# Patient Record
Sex: Female | Born: 1995
Health system: Southern US, Community
[De-identification: ages and names within clinical notes are randomized; demographics above are authoritative.]

## PROBLEM LIST (undated history)

## (undated) DIAGNOSIS — N1370 Vesicoureteral-reflux, unspecified: Secondary | ICD-10-CM

## (undated) HISTORY — DX: Vesicoureteral-reflux, unspecified: N13.70

---
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
## 2017-01-08 DIAGNOSIS — Z23 Encounter for immunization: Secondary | ICD-10-CM | POA: Diagnosis not present

## 2017-01-19 DIAGNOSIS — F321 Major depressive disorder, single episode, moderate: Secondary | ICD-10-CM | POA: Diagnosis not present

## 2017-01-26 DIAGNOSIS — F321 Major depressive disorder, single episode, moderate: Secondary | ICD-10-CM | POA: Diagnosis not present

## 2017-02-05 DIAGNOSIS — J3501 Chronic tonsillitis: Secondary | ICD-10-CM | POA: Diagnosis not present

## 2017-02-05 DIAGNOSIS — J353 Hypertrophy of tonsils with hypertrophy of adenoids: Secondary | ICD-10-CM | POA: Diagnosis not present

## 2017-02-05 DIAGNOSIS — J343 Hypertrophy of nasal turbinates: Secondary | ICD-10-CM | POA: Diagnosis not present

## 2017-02-05 DIAGNOSIS — J3503 Chronic tonsillitis and adenoiditis: Secondary | ICD-10-CM | POA: Diagnosis not present

## 2017-03-09 DIAGNOSIS — F321 Major depressive disorder, single episode, moderate: Secondary | ICD-10-CM | POA: Diagnosis not present

## 2017-03-16 DIAGNOSIS — F321 Major depressive disorder, single episode, moderate: Secondary | ICD-10-CM | POA: Diagnosis not present

## 2017-03-30 DIAGNOSIS — F321 Major depressive disorder, single episode, moderate: Secondary | ICD-10-CM | POA: Diagnosis not present

## 2017-04-13 DIAGNOSIS — F321 Major depressive disorder, single episode, moderate: Secondary | ICD-10-CM | POA: Diagnosis not present

## 2017-04-24 DIAGNOSIS — Z113 Encounter for screening for infections with a predominantly sexual mode of transmission: Secondary | ICD-10-CM | POA: Diagnosis not present

## 2017-05-11 DIAGNOSIS — F321 Major depressive disorder, single episode, moderate: Secondary | ICD-10-CM | POA: Diagnosis not present

## 2017-06-01 DIAGNOSIS — F321 Major depressive disorder, single episode, moderate: Secondary | ICD-10-CM | POA: Diagnosis not present

## 2017-08-31 DIAGNOSIS — F321 Major depressive disorder, single episode, moderate: Secondary | ICD-10-CM | POA: Diagnosis not present

## 2017-09-09 DIAGNOSIS — F321 Major depressive disorder, single episode, moderate: Secondary | ICD-10-CM | POA: Diagnosis not present

## 2017-11-16 DIAGNOSIS — F321 Major depressive disorder, single episode, moderate: Secondary | ICD-10-CM | POA: Diagnosis not present

## 2017-12-07 DIAGNOSIS — F321 Major depressive disorder, single episode, moderate: Secondary | ICD-10-CM | POA: Diagnosis not present

## 2017-12-24 DIAGNOSIS — F321 Major depressive disorder, single episode, moderate: Secondary | ICD-10-CM | POA: Diagnosis not present

## 2018-01-19 DIAGNOSIS — F321 Major depressive disorder, single episode, moderate: Secondary | ICD-10-CM | POA: Diagnosis not present

## 2018-01-20 DIAGNOSIS — L02426 Furuncle of left lower limb: Secondary | ICD-10-CM | POA: Diagnosis not present

## 2018-01-20 DIAGNOSIS — B9689 Other specified bacterial agents as the cause of diseases classified elsewhere: Secondary | ICD-10-CM | POA: Diagnosis not present

## 2018-01-20 DIAGNOSIS — L02425 Furuncle of right lower limb: Secondary | ICD-10-CM | POA: Diagnosis not present

## 2018-01-20 DIAGNOSIS — L0232 Furuncle of buttock: Secondary | ICD-10-CM | POA: Diagnosis not present

## 2018-02-02 DIAGNOSIS — F321 Major depressive disorder, single episode, moderate: Secondary | ICD-10-CM | POA: Diagnosis not present

## 2018-03-09 DIAGNOSIS — Z23 Encounter for immunization: Secondary | ICD-10-CM | POA: Diagnosis not present

## 2018-03-11 DIAGNOSIS — F321 Major depressive disorder, single episode, moderate: Secondary | ICD-10-CM | POA: Diagnosis not present

## 2018-03-29 DIAGNOSIS — F321 Major depressive disorder, single episode, moderate: Secondary | ICD-10-CM | POA: Diagnosis not present

## 2018-04-19 DIAGNOSIS — F321 Major depressive disorder, single episode, moderate: Secondary | ICD-10-CM | POA: Diagnosis not present

## 2018-05-03 DIAGNOSIS — F321 Major depressive disorder, single episode, moderate: Secondary | ICD-10-CM | POA: Diagnosis not present

## 2018-06-26 DIAGNOSIS — S29012A Strain of muscle and tendon of back wall of thorax, initial encounter: Secondary | ICD-10-CM | POA: Diagnosis not present

## 2018-06-26 DIAGNOSIS — S39012A Strain of muscle, fascia and tendon of lower back, initial encounter: Secondary | ICD-10-CM | POA: Diagnosis not present

## 2018-07-02 DIAGNOSIS — F321 Major depressive disorder, single episode, moderate: Secondary | ICD-10-CM | POA: Diagnosis not present

## 2018-07-30 ENCOUNTER — Telehealth: Payer: Self-pay

## 2018-07-30 NOTE — Telephone Encounter (Signed)
Copied from CRM [id number]. Topic: Appointment Scheduling - Scheduling Inquiry for Clinic >> Jul 29, 2018  4:21 PM Gwenlyn Fudge A wrote: Reason for CRM: Pt called requesting to schedule a physical, routine blood work, std testing. Pt is also requesting a referral for OBGYN. Please advise.

## 2018-07-30 NOTE — Telephone Encounter (Signed)
Patient scheduled appointment on 08/12/18.

## 2018-08-12 ENCOUNTER — Ambulatory Visit (INDEPENDENT_AMBULATORY_CARE_PROVIDER_SITE_OTHER): Payer: BC Managed Care – PPO | Admitting: Family Medicine

## 2018-08-12 ENCOUNTER — Other Ambulatory Visit: Payer: Self-pay

## 2018-08-12 ENCOUNTER — Encounter: Payer: Self-pay | Admitting: Family Medicine

## 2018-08-12 ENCOUNTER — Other Ambulatory Visit: Payer: Self-pay | Admitting: Internal Medicine

## 2018-08-12 VITALS — BP 128/83 | HR 88 | Temp 97.6°F | Ht 69.5 in | Wt 251.5 lb

## 2018-08-12 DIAGNOSIS — Z113 Encounter for screening for infections with a predominantly sexual mode of transmission: Secondary | ICD-10-CM

## 2018-08-12 DIAGNOSIS — Z30018 Encounter for initial prescription of other contraceptives: Secondary | ICD-10-CM | POA: Diagnosis not present

## 2018-08-12 DIAGNOSIS — Z309 Encounter for contraceptive management, unspecified: Secondary | ICD-10-CM | POA: Insufficient documentation

## 2018-08-12 DIAGNOSIS — F411 Generalized anxiety disorder: Secondary | ICD-10-CM

## 2018-08-12 DIAGNOSIS — E669 Obesity, unspecified: Secondary | ICD-10-CM | POA: Diagnosis not present

## 2018-08-12 DIAGNOSIS — Z0000 Encounter for general adult medical examination without abnormal findings: Secondary | ICD-10-CM | POA: Diagnosis not present

## 2018-08-12 LAB — CBC WITH DIFFERENTIAL/PLATELET
Basophils Absolute: 0 10*3/uL (ref 0.0–0.1)
Basophils Relative: 0.3 % (ref 0.0–3.0)
Eosinophils Absolute: 0.1 10*3/uL (ref 0.0–0.7)
Eosinophils Relative: 1.3 % (ref 0.0–5.0)
HCT: 39.1 % (ref 36.0–46.0)
Hemoglobin: 12.3 g/dL (ref 12.0–15.0)
Lymphocytes Relative: 25.4 % (ref 12.0–46.0)
Lymphs Abs: 1.4 10*3/uL (ref 0.7–4.0)
MCHC: 31.6 g/dL (ref 30.0–36.0)
MCV: 77.9 fl — ABNORMAL LOW (ref 78.0–100.0)
Monocytes Absolute: 0.5 10*3/uL (ref 0.1–1.0)
Monocytes Relative: 9.7 % (ref 3.0–12.0)
Neutro Abs: 3.4 10*3/uL (ref 1.4–7.7)
Neutrophils Relative %: 63.3 % (ref 43.0–77.0)
Platelets: 245 10*3/uL (ref 150.0–400.0)
RBC: 5.02 Mil/uL (ref 3.87–5.11)
RDW: 16.4 % — ABNORMAL HIGH (ref 11.5–14.6)
WBC: 5.4 10*3/uL (ref 4.5–10.5)

## 2018-08-12 LAB — COMPREHENSIVE METABOLIC PANEL
ALT: 12 U/L (ref 0–35)
AST: 12 U/L (ref 0–37)
Albumin: 4.3 g/dL (ref 3.5–5.2)
Alkaline Phosphatase: 59 U/L (ref 39–117)
BUN: 10 mg/dL (ref 6–23)
CO2: 23 mEq/L (ref 19–32)
Calcium: 9.3 mg/dL (ref 8.4–10.5)
Chloride: 107 mEq/L (ref 96–112)
Creatinine, Ser: 0.63 mg/dL (ref 0.40–1.20)
GFR: 119.55 mL/min (ref >60.00)
Glucose, Bld: 89 mg/dL (ref 70–99)
Potassium: 4 mEq/L (ref 3.5–5.1)
Sodium: 138 mEq/L (ref 135–145)
Total Bilirubin: 0.4 mg/dL (ref 0.2–1.2)
Total Protein: 7.1 g/dL (ref 6.0–8.3)

## 2018-08-12 LAB — LIPID PANEL
Cholesterol: 185 mg/dL (ref 0–200)
HDL: 43.8 mg/dL (ref >39.00)
LDL Cholesterol: 126 mg/dL — ABNORMAL HIGH (ref 0–99)
NonHDL: 141.38
Total CHOL/HDL Ratio: 4
Triglycerides: 75 mg/dL (ref 0.0–149.0)
VLDL: 15 mg/dL (ref 0.0–40.0)

## 2018-08-12 LAB — TSH: TSH: 2.22 u[IU]/mL (ref 0.35–5.50)

## 2018-08-12 NOTE — Patient Instructions (Signed)
Continue counseling Also healthy diet and exercise   Labs today   The office will call about gyn referral
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
## 2018-08-13 LAB — RPR: RPR Ser Ql: NONREACTIVE

## 2018-08-13 LAB — HIV ANTIBODY (ROUTINE TESTING W REFLEX): HIV 1&2 Ab, 4th Generation: NONREACTIVE

## 2018-08-13 LAB — C. TRACHOMATIS/N. GONORRHOEAE RNA
C. trachomatis RNA, TMA: NOT DETECTED
N. gonorrhoeae RNA, TMA: NOT DETECTED

## 2018-08-15 DIAGNOSIS — F411 Generalized anxiety disorder: Secondary | ICD-10-CM | POA: Insufficient documentation

## 2018-08-15 NOTE — Assessment & Plan Note (Signed)
Discussed how this problem influences overall health and the risks it imposes  Reviewed plan for weight loss with lower calorie diet (via better food choices and also portion control or program like weight watchers) and exercise building up to or more than 30 minutes 5 days per week including some aerobic activity    

## 2018-08-15 NOTE — Assessment & Plan Note (Signed)
Pt is interested in IUD  Ref to gyn made to discuss   STD screening today

## 2018-08-15 NOTE — Assessment & Plan Note (Signed)
Reviewed health habits including diet and exercise and skin cancer prevention Reviewed appropriate screening tests for age  Also reviewed health mt list, fam hx and immunization status , as well as social and family history   See HPI Labs ordered for wellness STD screening done Ref to gyn to discuss contraception  Enc good health habits and wt loss

## 2018-08-15 NOTE — Assessment & Plan Note (Signed)
Seeing counselor Has a support animal  Doing much better

## 2018-08-25 DIAGNOSIS — Z6837 Body mass index (BMI) 37.0-37.9, adult: Secondary | ICD-10-CM | POA: Diagnosis not present

## 2018-08-25 DIAGNOSIS — Z01419 Encounter for gynecological examination (general) (routine) without abnormal findings: Secondary | ICD-10-CM | POA: Diagnosis not present

## 2018-08-25 DIAGNOSIS — Z113 Encounter for screening for infections with a predominantly sexual mode of transmission: Secondary | ICD-10-CM | POA: Diagnosis not present

## 2018-09-14 DIAGNOSIS — Z30430 Encounter for insertion of intrauterine contraceptive device: Secondary | ICD-10-CM | POA: Diagnosis not present

## 2018-09-21 DIAGNOSIS — F321 Major depressive disorder, single episode, moderate: Secondary | ICD-10-CM | POA: Diagnosis not present

## 2018-10-25 DIAGNOSIS — Z20828 Contact with and (suspected) exposure to other viral communicable diseases: Secondary | ICD-10-CM | POA: Diagnosis not present

## 2018-11-09 DIAGNOSIS — Z20828 Contact with and (suspected) exposure to other viral communicable diseases: Secondary | ICD-10-CM | POA: Diagnosis not present

## 2018-12-07 DIAGNOSIS — Z20828 Contact with and (suspected) exposure to other viral communicable diseases: Secondary | ICD-10-CM | POA: Diagnosis not present

## 2019-03-16 DIAGNOSIS — Z23 Encounter for immunization: Secondary | ICD-10-CM | POA: Diagnosis not present

## 2019-03-23 DIAGNOSIS — Z23 Encounter for immunization: Secondary | ICD-10-CM | POA: Diagnosis not present

## 2019-04-06 ENCOUNTER — Ambulatory Visit (INDEPENDENT_AMBULATORY_CARE_PROVIDER_SITE_OTHER): Payer: BC Managed Care – PPO | Admitting: Family Medicine

## 2019-04-06 ENCOUNTER — Encounter: Payer: Self-pay | Admitting: Family Medicine

## 2019-04-06 DIAGNOSIS — M549 Dorsalgia, unspecified: Secondary | ICD-10-CM

## 2019-04-06 DIAGNOSIS — F411 Generalized anxiety disorder: Secondary | ICD-10-CM | POA: Diagnosis not present

## 2019-04-06 MED ORDER — TIZANIDINE HCL 4 MG PO TABS: 4.0000 mg | ORAL_TABLET | Freq: Four times a day (QID) | ORAL | 1 refills | Status: DC | PRN
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
## 2019-06-12 DIAGNOSIS — S93401A Sprain of unspecified ligament of right ankle, initial encounter: Secondary | ICD-10-CM | POA: Diagnosis not present

## 2019-08-30 ENCOUNTER — Encounter: Payer: Self-pay | Admitting: Family Medicine

## 2019-08-30 ENCOUNTER — Ambulatory Visit (INDEPENDENT_AMBULATORY_CARE_PROVIDER_SITE_OTHER): Payer: BC Managed Care – PPO | Admitting: Family Medicine

## 2019-08-30 ENCOUNTER — Other Ambulatory Visit: Payer: Self-pay

## 2019-08-30 VITALS — BP 114/60 | HR 77 | Temp 96.7°F | Resp 18 | Ht 69.0 in | Wt 301.0 lb

## 2019-08-30 DIAGNOSIS — E785 Hyperlipidemia, unspecified: Secondary | ICD-10-CM | POA: Diagnosis not present

## 2019-08-30 DIAGNOSIS — Z01419 Encounter for gynecological examination (general) (routine) without abnormal findings: Secondary | ICD-10-CM | POA: Diagnosis not present

## 2019-08-30 DIAGNOSIS — Z113 Encounter for screening for infections with a predominantly sexual mode of transmission: Secondary | ICD-10-CM | POA: Diagnosis not present

## 2019-08-30 DIAGNOSIS — Z6841 Body Mass Index (BMI) 40.0 and over, adult: Secondary | ICD-10-CM | POA: Diagnosis not present

## 2019-08-30 DIAGNOSIS — M549 Dorsalgia, unspecified: Secondary | ICD-10-CM

## 2019-08-30 DIAGNOSIS — N920 Excessive and frequent menstruation with regular cycle: Secondary | ICD-10-CM

## 2019-08-30 DIAGNOSIS — Z0000 Encounter for general adult medical examination without abnormal findings: Secondary | ICD-10-CM

## 2019-08-30 DIAGNOSIS — Z1159 Encounter for screening for other viral diseases: Secondary | ICD-10-CM | POA: Diagnosis not present

## 2019-08-30 LAB — HM PAP SMEAR

## 2019-08-30 NOTE — Assessment & Plan Note (Signed)
Added this to labs Also doing other std screen  No symptoms

## 2019-08-30 NOTE — Assessment & Plan Note (Signed)
Disc imp of safe sexual practices Lab for screen, gc/chlam , HIV , RPR, hep C  Is utd pap from yesterday

## 2019-08-30 NOTE — Assessment & Plan Note (Signed)
Reviewed health habits including diet and exercise and skin cancer prevention Reviewed appropriate screening tests for age  Also reviewed health mt list, fam hx and immunization status , as well as social and family history   Labs ordered for wellness and also for STD screening  Disc strategy for wt loss (needs to work on emotional eating- NOOM program recommended)  [facility] Encouraged flu shot in the fall  Enc more exercise  Rev PHQ questions - stress was high and improved now

## 2019-08-30 NOTE — Patient Instructions (Addendum)
Work on coping mechanisms for stress- outdoor activities and walking dog/exercise  Add more activity in general  Socialize more also - think about doing active things together   Call us with the dates of your covid vaccines so we can put them in your chart   For cholesterol  Avoid red meat/ fried foods/ egg yolks/ fatty breakfast meats/ butter, cheese and high fat dairy/ and shellfish    Look into the NOOM program for healthy weight and to work on emotional eating   Labs today  STD screen today   I will place a PT referral and the office will call you

## 2019-08-30 NOTE — Assessment & Plan Note (Signed)
Disc goals for lipids and reasons to control them Rev last labs with pt Rev low sat fat diet in detail High LDL runs in family  Diet is sub optimal Lab today  Info given re: diet improvement
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
## 2019-08-30 NOTE — Assessment & Plan Note (Signed)
Doing better with progestin IUD
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
## 2019-08-30 NOTE — Assessment & Plan Note (Signed)
Discussed how this problem influences overall health and the risks it imposes  Reviewed plan for weight loss with lower calorie diet (via better food choices and also portion control or program like weight watchers) and exercise building up to or more than 30 minutes 5 days per week including some aerobic activity   Emotional eating is a problem Suggested NOOM program on line  May also benefit from healthy wt and wellness clinic in the future

## 2019-08-31 LAB — CBC WITH DIFFERENTIAL/PLATELET
Basophils Absolute: 0 10*3/uL (ref 0.0–0.1)
Basophils Relative: 0.4 % (ref 0.0–3.0)
Eosinophils Absolute: 0.1 10*3/uL (ref 0.0–0.7)
Eosinophils Relative: 1.2 % (ref 0.0–5.0)
HCT: 38.6 % (ref 36.0–46.0)
Hemoglobin: 13 g/dL (ref 12.0–15.0)
Lymphocytes Relative: 22.7 % (ref 12.0–46.0)
Lymphs Abs: 1.9 10*3/uL (ref 0.7–4.0)
MCHC: 33.6 g/dL (ref 30.0–36.0)
MCV: 84 fl (ref 78.0–100.0)
Monocytes Absolute: 0.6 10*3/uL (ref 0.1–1.0)
Monocytes Relative: 7.3 % (ref 3.0–12.0)
Neutro Abs: 5.6 10*3/uL (ref 1.4–7.7)
Neutrophils Relative %: 68.4 % (ref 43.0–77.0)
Platelets: 222 10*3/uL (ref 150.0–400.0)
RBC: 4.6 Mil/uL (ref 3.87–5.11)
RDW: 13.9 % (ref 11.5–15.5)
WBC: 8.2 10*3/uL (ref 4.0–10.5)

## 2019-08-31 LAB — COMPREHENSIVE METABOLIC PANEL
ALT: 22 U/L (ref 0–35)
AST: 15 U/L (ref 0–37)
Albumin: 4.3 g/dL (ref 3.5–5.2)
Alkaline Phosphatase: 65 U/L (ref 39–117)
BUN: 16 mg/dL (ref 6–23)
CO2: 26 mEq/L (ref 19–32)
Calcium: 9.1 mg/dL (ref 8.4–10.5)
Chloride: 105 mEq/L (ref 96–112)
Creatinine, Ser: 0.61 mg/dL (ref 0.40–1.20)
GFR: 122.85 mL/min (ref >60.00)
Glucose, Bld: 67 mg/dL — ABNORMAL LOW (ref 70–99)
Potassium: 4.1 mEq/L (ref 3.5–5.1)
Sodium: 139 mEq/L (ref 135–145)
Total Bilirubin: 0.3 mg/dL (ref 0.2–1.2)
Total Protein: 7.1 g/dL (ref 6.0–8.3)

## 2019-08-31 LAB — LIPID PANEL
Cholesterol: 223 mg/dL — ABNORMAL HIGH (ref 0–200)
HDL: 38.1 mg/dL — ABNORMAL LOW (ref >39.00)
NonHDL: 184.56
Total CHOL/HDL Ratio: 6
Triglycerides: 297 mg/dL — ABNORMAL HIGH (ref 0.0–149.0)
VLDL: 59.4 mg/dL — ABNORMAL HIGH (ref 0.0–40.0)

## 2019-08-31 LAB — HEPATITIS C ANTIBODY
Hepatitis C Ab: NONREACTIVE
SIGNAL TO CUT-OFF: 0.01 (ref <1.00)

## 2019-08-31 LAB — TSH: TSH: 1.56 u[IU]/mL (ref 0.35–4.50)

## 2019-08-31 LAB — LDL CHOLESTEROL, DIRECT: Direct LDL: 157 mg/dL

## 2019-08-31 LAB — C. TRACHOMATIS/N. GONORRHOEAE RNA
C. trachomatis RNA, TMA: NOT DETECTED
N. gonorrhoeae RNA, TMA: NOT DETECTED

## 2019-08-31 LAB — HIV ANTIBODY (ROUTINE TESTING W REFLEX): HIV 1&2 Ab, 4th Generation: NONREACTIVE

## 2019-08-31 LAB — RPR: RPR Ser Ql: NONREACTIVE

## 2019-09-08 DIAGNOSIS — S39012D Strain of muscle, fascia and tendon of lower back, subsequent encounter: Secondary | ICD-10-CM | POA: Diagnosis not present

## 2019-09-08 DIAGNOSIS — M545 Low back pain: Secondary | ICD-10-CM | POA: Diagnosis not present

## 2019-09-12 DIAGNOSIS — S39012D Strain of muscle, fascia and tendon of lower back, subsequent encounter: Secondary | ICD-10-CM | POA: Diagnosis not present

## 2019-09-12 DIAGNOSIS — M545 Low back pain: Secondary | ICD-10-CM | POA: Diagnosis not present

## 2019-09-16 DIAGNOSIS — M545 Low back pain: Secondary | ICD-10-CM | POA: Diagnosis not present

## 2019-09-16 DIAGNOSIS — S39012D Strain of muscle, fascia and tendon of lower back, subsequent encounter: Secondary | ICD-10-CM | POA: Diagnosis not present

## 2019-09-20 DIAGNOSIS — S39012D Strain of muscle, fascia and tendon of lower back, subsequent encounter: Secondary | ICD-10-CM | POA: Diagnosis not present

## 2019-09-20 DIAGNOSIS — M545 Low back pain: Secondary | ICD-10-CM | POA: Diagnosis not present

## 2019-09-22 DIAGNOSIS — M545 Low back pain: Secondary | ICD-10-CM | POA: Diagnosis not present

## 2019-09-22 DIAGNOSIS — S39012D Strain of muscle, fascia and tendon of lower back, subsequent encounter: Secondary | ICD-10-CM | POA: Diagnosis not present

## 2019-09-26 DIAGNOSIS — R87612 Low grade squamous intraepithelial lesion on cytologic smear of cervix (LGSIL): Secondary | ICD-10-CM | POA: Insufficient documentation

## 2019-09-27 DIAGNOSIS — M545 Low back pain: Secondary | ICD-10-CM | POA: Diagnosis not present

## 2019-09-27 DIAGNOSIS — S39012D Strain of muscle, fascia and tendon of lower back, subsequent encounter: Secondary | ICD-10-CM | POA: Diagnosis not present

## 2019-09-29 DIAGNOSIS — S39012D Strain of muscle, fascia and tendon of lower back, subsequent encounter: Secondary | ICD-10-CM | POA: Diagnosis not present

## 2019-09-29 DIAGNOSIS — M545 Low back pain: Secondary | ICD-10-CM | POA: Diagnosis not present

## 2019-10-04 DIAGNOSIS — M545 Low back pain: Secondary | ICD-10-CM | POA: Diagnosis not present

## 2019-10-04 DIAGNOSIS — S39012D Strain of muscle, fascia and tendon of lower back, subsequent encounter: Secondary | ICD-10-CM | POA: Diagnosis not present

## 2019-10-06 DIAGNOSIS — M545 Low back pain: Secondary | ICD-10-CM | POA: Diagnosis not present

## 2019-10-06 DIAGNOSIS — S39012D Strain of muscle, fascia and tendon of lower back, subsequent encounter: Secondary | ICD-10-CM | POA: Diagnosis not present

## 2019-10-07 DIAGNOSIS — N870 Mild cervical dysplasia: Secondary | ICD-10-CM | POA: Diagnosis not present

## 2020-01-29 ENCOUNTER — Telehealth: Payer: Self-pay | Admitting: Family Medicine

## 2020-01-29 DIAGNOSIS — E785 Hyperlipidemia, unspecified: Secondary | ICD-10-CM

## 2020-01-29 NOTE — Telephone Encounter (Signed)
-----   Message from Aquilla Solian, RT sent at 01/17/2020  7:47 AM EST ----- Regarding: Lab Orders for Monday 12.6.2021 Please place lab orders for Monday 12.6.2021, appt notes state "recheck lipids" Thank you, Jones Bales RT(R)

## 2020-01-30 ENCOUNTER — Other Ambulatory Visit: Payer: Self-pay

## 2020-01-30 ENCOUNTER — Other Ambulatory Visit (INDEPENDENT_AMBULATORY_CARE_PROVIDER_SITE_OTHER): Payer: BC Managed Care – PPO

## 2020-01-30 DIAGNOSIS — E785 Hyperlipidemia, unspecified: Secondary | ICD-10-CM | POA: Diagnosis not present

## 2020-01-30 LAB — LIPID PANEL
Cholesterol: 202 mg/dL — ABNORMAL HIGH (ref 0–200)
HDL: 39.7 mg/dL (ref >39.00)
LDL Cholesterol: 140 mg/dL — ABNORMAL HIGH (ref 0–99)
NonHDL: 162.7
Total CHOL/HDL Ratio: 5
Triglycerides: 113 mg/dL (ref 0.0–149.0)
VLDL: 22.6 mg/dL (ref 0.0–40.0)

## 2020-01-31 ENCOUNTER — Encounter: Payer: Self-pay | Admitting: *Deleted

## 2020-02-01 ENCOUNTER — Ambulatory Visit (INDEPENDENT_AMBULATORY_CARE_PROVIDER_SITE_OTHER): Payer: BC Managed Care – PPO | Admitting: Family Medicine

## 2020-02-01 ENCOUNTER — Encounter: Payer: Self-pay | Admitting: Family Medicine

## 2020-02-01 ENCOUNTER — Ambulatory Visit (INDEPENDENT_AMBULATORY_CARE_PROVIDER_SITE_OTHER)
Admission: RE | Admit: 2020-02-01 | Discharge: 2020-02-01 | Disposition: A | Discharge status: Home / Self Care | Payer: BC Managed Care – PPO | Source: Ambulatory Visit | Attending: Family Medicine | Admitting: Family Medicine

## 2020-02-01 ENCOUNTER — Other Ambulatory Visit: Payer: Self-pay

## 2020-02-01 VITALS — BP 120/80 | HR 81 | Temp 98.0°F | Ht 69.0 in | Wt 288.8 lb

## 2020-02-01 DIAGNOSIS — S52121A Displaced fracture of head of right radius, initial encounter for closed fracture: Secondary | ICD-10-CM | POA: Diagnosis not present

## 2020-02-01 DIAGNOSIS — M25571 Pain in right ankle and joints of right foot: Secondary | ICD-10-CM

## 2020-02-01 DIAGNOSIS — M7989 Other specified soft tissue disorders: Secondary | ICD-10-CM | POA: Diagnosis not present

## 2020-02-01 DIAGNOSIS — M25521 Pain in right elbow: Secondary | ICD-10-CM | POA: Diagnosis not present

## 2020-02-01 DIAGNOSIS — W19XXXA Unspecified fall, initial encounter: Secondary | ICD-10-CM

## 2020-02-01 DIAGNOSIS — S52124A Nondisplaced fracture of head of right radius, initial encounter for closed fracture: Secondary | ICD-10-CM
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
## 2020-02-02 DIAGNOSIS — M25521 Pain in right elbow: Secondary | ICD-10-CM | POA: Diagnosis not present

## 2020-02-02 DIAGNOSIS — S52121A Displaced fracture of head of right radius, initial encounter for closed fracture: Secondary | ICD-10-CM | POA: Diagnosis not present

## 2020-02-03 ENCOUNTER — Ambulatory Visit
Admission: RE | Admit: 2020-02-03 | Discharge: 2020-02-03 | Disposition: A | Discharge status: Home / Self Care | Payer: BC Managed Care – PPO | Source: Ambulatory Visit | Attending: Orthopedic Surgery | Admitting: Orthopedic Surgery

## 2020-02-03 ENCOUNTER — Other Ambulatory Visit: Payer: Self-pay | Admitting: Orthopedic Surgery

## 2020-02-03 ENCOUNTER — Other Ambulatory Visit: Payer: Self-pay

## 2020-02-03 DIAGNOSIS — M25421 Effusion, right elbow: Secondary | ICD-10-CM | POA: Diagnosis not present

## 2020-02-03 DIAGNOSIS — S52121A Displaced fracture of head of right radius, initial encounter for closed fracture: Secondary | ICD-10-CM

## 2020-02-03 DIAGNOSIS — F329 Major depressive disorder, single episode, unspecified: Secondary | ICD-10-CM | POA: Diagnosis not present

## 2020-02-03 DIAGNOSIS — S52571A Other intraarticular fracture of lower end of right radius, initial encounter for closed fracture: Secondary | ICD-10-CM | POA: Diagnosis not present

## 2020-02-06 ENCOUNTER — Ambulatory Visit: Payer: BC Managed Care – PPO | Admitting: Family Medicine

## 2020-02-06 DIAGNOSIS — G8918 Other acute postprocedural pain: Secondary | ICD-10-CM | POA: Diagnosis not present

## 2020-02-06 DIAGNOSIS — S52121A Displaced fracture of head of right radius, initial encounter for closed fracture: Secondary | ICD-10-CM | POA: Diagnosis not present

## 2020-02-06 DIAGNOSIS — Y999 Unspecified external cause status: Secondary | ICD-10-CM | POA: Diagnosis not present

## 2020-02-06 DIAGNOSIS — X58XXXA Exposure to other specified factors, initial encounter: Secondary | ICD-10-CM | POA: Diagnosis not present

## 2020-02-21 DIAGNOSIS — S52121D Displaced fracture of head of right radius, subsequent encounter for closed fracture with routine healing: Secondary | ICD-10-CM | POA: Diagnosis not present

## 2020-02-24 DIAGNOSIS — Z03818 Encounter for observation for suspected exposure to other biological agents ruled out: Secondary | ICD-10-CM | POA: Diagnosis not present

## 2020-03-01 DIAGNOSIS — M25521 Pain in right elbow: Secondary | ICD-10-CM | POA: Diagnosis not present

## 2020-03-08 DIAGNOSIS — M25521 Pain in right elbow: Secondary | ICD-10-CM | POA: Diagnosis not present

## 2020-03-20 DIAGNOSIS — S52121D Displaced fracture of head of right radius, subsequent encounter for closed fracture with routine healing: Secondary | ICD-10-CM | POA: Diagnosis not present

## 2020-04-04 DIAGNOSIS — M25521 Pain in right elbow: Secondary | ICD-10-CM | POA: Diagnosis not present

## 2020-04-19 DIAGNOSIS — M25521 Pain in right elbow: Secondary | ICD-10-CM | POA: Diagnosis not present

## 2020-04-20 DIAGNOSIS — S52121D Displaced fracture of head of right radius, subsequent encounter for closed fracture with routine healing: Secondary | ICD-10-CM | POA: Diagnosis not present

## 2020-04-26 DIAGNOSIS — M25521 Pain in right elbow: Secondary | ICD-10-CM | POA: Diagnosis not present

## 2020-04-27 DIAGNOSIS — N870 Mild cervical dysplasia: Secondary | ICD-10-CM | POA: Diagnosis not present

## 2020-04-27 DIAGNOSIS — Z3202 Encounter for pregnancy test, result negative: Secondary | ICD-10-CM | POA: Diagnosis not present

## 2020-04-27 DIAGNOSIS — R87612 Low grade squamous intraepithelial lesion on cytologic smear of cervix (LGSIL): Secondary | ICD-10-CM | POA: Diagnosis not present

## 2020-05-03 DIAGNOSIS — M25521 Pain in right elbow: Secondary | ICD-10-CM | POA: Diagnosis not present

## 2020-05-10 DIAGNOSIS — M25521 Pain in right elbow: Secondary | ICD-10-CM | POA: Diagnosis not present

## 2020-06-13 DIAGNOSIS — N870 Mild cervical dysplasia: Secondary | ICD-10-CM | POA: Diagnosis not present

## 2020-06-13 DIAGNOSIS — N879 Dysplasia of cervix uteri, unspecified: Secondary | ICD-10-CM | POA: Diagnosis not present

## 2020-09-10 DIAGNOSIS — Z6839 Body mass index (BMI) 39.0-39.9, adult: Secondary | ICD-10-CM | POA: Diagnosis not present

## 2020-09-10 DIAGNOSIS — Z01419 Encounter for gynecological examination (general) (routine) without abnormal findings: Secondary | ICD-10-CM | POA: Diagnosis not present

## 2020-09-10 DIAGNOSIS — Z113 Encounter for screening for infections with a predominantly sexual mode of transmission: Secondary | ICD-10-CM | POA: Diagnosis not present

## 2020-09-18 ENCOUNTER — Encounter: Payer: Self-pay | Admitting: Family Medicine

## 2020-09-18 ENCOUNTER — Ambulatory Visit (INDEPENDENT_AMBULATORY_CARE_PROVIDER_SITE_OTHER): Payer: BC Managed Care – PPO | Admitting: Family Medicine

## 2020-09-18 ENCOUNTER — Other Ambulatory Visit: Payer: Self-pay

## 2020-09-18 VITALS — BP 118/78 | HR 85 | Temp 98.3°F | Ht 69.5 in | Wt 259.4 lb

## 2020-09-18 DIAGNOSIS — E669 Obesity, unspecified: Secondary | ICD-10-CM | POA: Insufficient documentation

## 2020-09-18 DIAGNOSIS — E785 Hyperlipidemia, unspecified: Secondary | ICD-10-CM | POA: Diagnosis not present

## 2020-09-18 DIAGNOSIS — Z800 Family history of malignant neoplasm of digestive organs: Secondary | ICD-10-CM

## 2020-09-18 DIAGNOSIS — Z0000 Encounter for general adult medical examination without abnormal findings: Secondary | ICD-10-CM | POA: Diagnosis not present

## 2020-09-18 DIAGNOSIS — E6609 Other obesity due to excess calories: Secondary | ICD-10-CM | POA: Diagnosis not present

## 2020-09-18 DIAGNOSIS — Z6837 Body mass index (BMI) 37.0-37.9, adult: Secondary | ICD-10-CM

## 2020-09-18 DIAGNOSIS — R87612 Low grade squamous intraepithelial lesion on cytologic smear of cervix (LGSIL): Secondary | ICD-10-CM

## 2020-09-18 DIAGNOSIS — F430 Acute stress reaction: Secondary | ICD-10-CM

## 2020-09-18 LAB — TSH: TSH: 1.32 u[IU]/mL (ref 0.35–5.50)

## 2020-09-18 LAB — COMPREHENSIVE METABOLIC PANEL
ALT: 14 U/L (ref 0–35)
AST: 12 U/L (ref 0–37)
Albumin: 4.5 g/dL (ref 3.5–5.2)
Alkaline Phosphatase: 58 U/L (ref 39–117)
BUN: 12 mg/dL (ref 6–23)
CO2: 25 mEq/L (ref 19–32)
Calcium: 9.5 mg/dL (ref 8.4–10.5)
Chloride: 104 mEq/L (ref 96–112)
Creatinine, Ser: 0.58 mg/dL (ref 0.40–1.20)
GFR: 128.05 mL/min (ref >60.00)
Glucose, Bld: 82 mg/dL (ref 70–99)
Potassium: 4.4 mEq/L (ref 3.5–5.1)
Sodium: 138 mEq/L (ref 135–145)
Total Bilirubin: 0.5 mg/dL (ref 0.2–1.2)
Total Protein: 7.2 g/dL (ref 6.0–8.3)

## 2020-09-18 LAB — LIPID PANEL
Cholesterol: 204 mg/dL — ABNORMAL HIGH (ref 0–200)
HDL: 40.8 mg/dL (ref >39.00)
LDL Cholesterol: 141 mg/dL — ABNORMAL HIGH (ref 0–99)
NonHDL: 162.96
Total CHOL/HDL Ratio: 5
Triglycerides: 110 mg/dL (ref 0.0–149.0)
VLDL: 22 mg/dL (ref 0.0–40.0)

## 2020-09-18 LAB — CBC WITH DIFFERENTIAL/PLATELET
Basophils Absolute: 0 10*3/uL (ref 0.0–0.1)
Basophils Relative: 0.3 % (ref 0.0–3.0)
Eosinophils Absolute: 0 10*3/uL (ref 0.0–0.7)
Eosinophils Relative: 0.8 % (ref 0.0–5.0)
HCT: 43.4 % (ref 36.0–46.0)
Hemoglobin: 14.3 g/dL (ref 12.0–15.0)
Lymphocytes Relative: 34.2 % (ref 12.0–46.0)
Lymphs Abs: 1.9 10*3/uL (ref 0.7–4.0)
MCHC: 33 g/dL (ref 30.0–36.0)
MCV: 87.9 fl (ref 78.0–100.0)
Monocytes Absolute: 0.5 10*3/uL (ref 0.1–1.0)
Monocytes Relative: 10 % (ref 3.0–12.0)
Neutro Abs: 3 10*3/uL (ref 1.4–7.7)
Neutrophils Relative %: 54.7 % (ref 43.0–77.0)
Platelets: 229 10*3/uL (ref 150.0–400.0)
RBC: 4.94 Mil/uL (ref 3.87–5.11)
RDW: 13.1 % (ref 11.5–15.5)
WBC: 5.5 10*3/uL (ref 4.0–10.5)

## 2020-09-18 NOTE — Patient Instructions (Addendum)
Get back to weights when you can  Walk also   Keep watching your diet   Get flu shot in the fall   Take 2000 iu vitamin D3 every day for bone health   Labs today
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
## 2020-09-19 NOTE — Assessment & Plan Note (Signed)
Doing better now  Rev PHQ scores  Does not think she needs tx

## 2020-09-19 NOTE — Assessment & Plan Note (Signed)
Reviewed health habits including diet and exercise and skin cancer prevention Reviewed appropriate screening tests for age  Also reviewed health mt list, fam hx and immunization status , as well as social and family history   See HPI Labs ordered  utd pap and gyn care  IUD for contraception  Commended wt loss with diet and exercise  Enc flu shot in the fall  covid vaccinated

## 2020-09-19 NOTE — Assessment & Plan Note (Signed)
Sees gyn and has pap every 6 mo  Per pt colpo result was ok  Has been vaccinated for HPV

## 2020-09-19 NOTE — Assessment & Plan Note (Signed)
Discussed how this problem influences overall health and the risks it imposes  Reviewed plan for weight loss with lower calorie diet (via better food choices and also portion control or program like weight watchers) and exercise building up to or more than 30 minutes 5 days per week including some aerobic activity   Commended with wt loss so far

## 2020-09-19 NOTE — Assessment & Plan Note (Signed)
Father had colon cancer in his 59s Will watch guidelines and presume she will need first colonoscopy at around 24

## 2020-09-19 NOTE — Assessment & Plan Note (Signed)
Disc goals for lipids and reasons to control them Rev last labs with pt Rev low sat fat diet in detail Lab today  Has worked on diet and expect some improvement

## 2020-09-20 ENCOUNTER — Telehealth: Payer: Self-pay | Admitting: *Deleted

## 2020-09-20 NOTE — Telephone Encounter (Addendum)
Pt sent a message to check on the status of her school forms. I checked PCP's desk and I did find a school form for PCP to complete during CPE but it wasn't completed. I placed the forms on the top of PCP's pile of ppw to do, so PCP can review it once she returns

## 2020-09-21 NOTE — Telephone Encounter (Signed)
Called pt and no answer and no VM so had Irving Burton email her and let her know forms ready

## 2020-09-21 NOTE — Telephone Encounter (Signed)
Please fill in imms, thanks  In IN box

## 2021-03-08 ENCOUNTER — Encounter: Payer: Self-pay | Admitting: Family Medicine

## 2021-03-08 ENCOUNTER — Other Ambulatory Visit: Payer: Self-pay

## 2021-03-08 ENCOUNTER — Ambulatory Visit (INDEPENDENT_AMBULATORY_CARE_PROVIDER_SITE_OTHER): Payer: BC Managed Care – PPO | Admitting: Family Medicine

## 2021-03-08 VITALS — BP 122/76 | HR 88 | Temp 97.5°F | Ht 69.5 in | Wt 271.1 lb

## 2021-03-08 DIAGNOSIS — M549 Dorsalgia, unspecified: Secondary | ICD-10-CM | POA: Diagnosis not present

## 2021-03-08 DIAGNOSIS — F430 Acute stress reaction: Secondary | ICD-10-CM | POA: Diagnosis not present

## 2021-03-08 DIAGNOSIS — R350 Frequency of micturition: Secondary | ICD-10-CM | POA: Diagnosis not present

## 2021-03-08 LAB — POC URINALSYSI DIPSTICK (AUTOMATED)
Bilirubin, UA: NEGATIVE
Blood, UA: NEGATIVE
Glucose, UA: NEGATIVE
Ketones, UA: NEGATIVE
Leukocytes, UA: NEGATIVE
Nitrite, UA: NEGATIVE
Protein, UA: NEGATIVE
Spec Grav, UA: 1.01 (ref 1.010–1.025)
Urobilinogen, UA: 0.2 E.U./dL
pH, UA: 6 (ref 5.0–8.0)

## 2021-03-08 MED ORDER — MELOXICAM 15 MG PO TABS: 15.0000 mg | ORAL_TABLET | Freq: Every day | ORAL | 0 refills | Status: DC | PRN

## 2021-03-08 MED ORDER — CYCLOBENZAPRINE HCL 10 MG PO TABS: 5.0000 mg | ORAL_TABLET | Freq: Every evening | ORAL | 0 refills | Status: DC | PRN
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
## 2021-03-08 NOTE — Patient Instructions (Addendum)
Use heat for 10 minutes at a time  Massage is ok   Sleep with pillow under or between knees   Slow walking is the best thing for back pain   Do the stretches/exercise on the handout   Take the meloxicam as directed (anti inflammatory pain med) Acetaminophen is fine  Try the generic flexeril (muscle relaxer) at bedtime with caution of sedation    Update if not starting to improve in a week or if worsening

## 2021-03-10 DIAGNOSIS — R350 Frequency of micturition: Secondary | ICD-10-CM | POA: Insufficient documentation

## 2021-03-10 NOTE — Assessment & Plan Note (Signed)
Father with chronic illness/has to help care for him and help her mother  High stress at home Also work and school Disc options for counseling incl school  Supplied # for [employer] [facility] and some private practices

## 2021-03-10 NOTE — Assessment & Plan Note (Signed)
Clear ua  Doubt related to her back pain  Will watch for dysuria or other symptoms

## 2021-03-10 NOTE — Assessment & Plan Note (Signed)
Low back pain worse on left without radicular symptoms  Exam consistent with spasm Px meloxicam Flexeril for pm if needed with caution of sedation  Handout with stretches to start given Update if not starting to improve in a week or if worsening

## 2021-03-11 ENCOUNTER — Ambulatory Visit: Payer: BC Managed Care – PPO | Admitting: Family Medicine

## 2021-03-26 DIAGNOSIS — R87611 Atypical squamous cells cannot exclude high grade squamous intraepithelial lesion on cytologic smear of cervix (ASC-H): Secondary | ICD-10-CM | POA: Diagnosis not present

## 2021-03-26 DIAGNOSIS — N879 Dysplasia of cervix uteri, unspecified: Secondary | ICD-10-CM | POA: Diagnosis not present

## 2021-04-24 DIAGNOSIS — F4321 Adjustment disorder with depressed mood: Secondary | ICD-10-CM | POA: Diagnosis not present

## 2021-04-24 DIAGNOSIS — F4310 Post-traumatic stress disorder, unspecified: Secondary | ICD-10-CM | POA: Diagnosis not present

## 2021-04-24 DIAGNOSIS — F411 Generalized anxiety disorder: Secondary | ICD-10-CM | POA: Diagnosis not present

## 2021-06-10 DIAGNOSIS — F4321 Adjustment disorder with depressed mood: Secondary | ICD-10-CM | POA: Diagnosis not present

## 2021-06-10 DIAGNOSIS — F411 Generalized anxiety disorder: Secondary | ICD-10-CM | POA: Diagnosis not present

## 2021-06-10 DIAGNOSIS — F4310 Post-traumatic stress disorder, unspecified: Secondary | ICD-10-CM | POA: Diagnosis not present

## 2021-06-18 DIAGNOSIS — F411 Generalized anxiety disorder: Secondary | ICD-10-CM | POA: Diagnosis not present

## 2021-06-18 DIAGNOSIS — F4321 Adjustment disorder with depressed mood: Secondary | ICD-10-CM | POA: Diagnosis not present

## 2021-06-18 DIAGNOSIS — F4310 Post-traumatic stress disorder, unspecified: Secondary | ICD-10-CM | POA: Diagnosis not present

## 2021-07-08 ENCOUNTER — Ambulatory Visit (INDEPENDENT_AMBULATORY_CARE_PROVIDER_SITE_OTHER): Payer: BC Managed Care – PPO | Admitting: Internal Medicine

## 2021-07-08 ENCOUNTER — Encounter: Payer: Self-pay | Admitting: Internal Medicine

## 2021-07-08 DIAGNOSIS — H109 Unspecified conjunctivitis: Secondary | ICD-10-CM | POA: Diagnosis not present

## 2021-07-08 MED ORDER — POLYMYXIN B-TRIMETHOPRIM 10000-0.1 UNIT/ML-% OP SOLN: 1.0000 [drp] | Freq: Four times a day (QID) | OPHTHALMIC | 0 refills | Status: DC

## 2021-07-08 NOTE — Assessment & Plan Note (Addendum)
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

## 2021-07-08 NOTE — Progress Notes (Signed)
? ?  Subjective:  ? ? Patient ID: Sharon Lee, female    DOB: December 20, 1995, 26 y.o.   MRN: [id number] ? ?HPI ?Here due to possible pink eye ? ?Was watching boyfriend's 44 year old cousin ?Sent home 5/11 with pink eye ?She saw him that day ? ?Now this morning--right eye had gunk in it first thing ?Feels achy ?Some sore throat ?No fever ?Slight dry cough ?No SOB ? ?Current Outpatient Medications on File Prior to Visit  ?Medication Sig Dispense Refill  ? cyclobenzaprine (FLEXERIL) 10 MG tablet Take 0.5-1 tablets (5-10 mg total) by mouth at bedtime as needed for muscle spasms. Caution of sedation 20 tablet 0  ? levonorgestrel (MIRENA, 52 MG,) 20 MCG/24HR IUD Mirena 20 mcg/24 hours (7 yrs) 52 mg intrauterine device ? Take 1 device by intrauterine route.    ? meloxicam (MOBIC) 15 MG tablet Take 1 tablet (15 mg total) by mouth daily as needed for pain (back pain). With food 30 tablet 0  ? ?No current facility-administered medications on file prior to visit.  ? ? ?No Known Allergies ? ?Past Medical History:  ?Diagnosis Date  ? Allergic rhinitis   ? Vesico-ureteral reflux   ? ? ?Past Surgical History:  ?Procedure Laterality Date  ? MYRINGOTOMY  11/2000  ?  tubes  ? ? ?Family History  ?Problem Relation Age of Onset  ? Allergies Mother   ? Cancer Maternal Grandmother   ?     breast  ? Diabetes Paternal Grandmother   ? Cirrhosis Paternal Grandmother   ? ? ?Social History  ? ?Socioeconomic History  ? Marital status: Single  ?  Spouse name: Not on file  ? Number of children: Not on file  ? Years of education: Not on file  ? Highest education level: Not on file  ?Occupational History  ? Not on file  ?Tobacco Use  ? Smoking status: Never  ? Smokeless tobacco: Never  ?Substance and Sexual Activity  ? Alcohol use: No  ?  Alcohol/week: 0.0 standard drinks  ? Drug use: No  ? Sexual activity: Not on file  ?Other Topics Concern  ? Not on file  ?Social History Narrative  ? Not on file  ? ?Social Determinants of Health  ? ?Financial  Resource Strain: Not on file  ?Food Insecurity: Not on file  ?Transportation Needs: Not on file  ?Physical Activity: Not on file  ?Stress: Not on file  ?Social Connections: Not on file  ?Intimate Partner Violence: Not on file  ? ?Review of Systems ?No N/V ?Eating okay ? ?   ?Objective:  ? Physical Exam ?Constitutional:   ?   Appearance: Normal appearance.  ?HENT:  ?   Mouth/Throat:  ?   Pharynx: No oropharyngeal exudate or posterior oropharyngeal erythema.  ?Eyes:  ?   Pupils: Pupils are equal, round, and reactive to light.  ?   Comments: Mild conjunctival injection ---- right>>left  ?Pulmonary:  ?   Effort: Pulmonary effort is normal.  ?   Breath sounds: Normal breath sounds. No wheezing or rales.  ?Musculoskeletal:  ?   Cervical back: Neck supple.  ?Lymphadenopathy:  ?   Cervical: No cervical adenopathy.  ?Neurological:  ?   Mental Status: She is alert.  ?  ? ? ? ? ?   ?Assessment & Plan:  ? ?

## 2021-07-10 IMAGING — CT CT ELBOW*R* W/O CM
1 of 2 series · 1 of 9 positions shown · non-contrast
Comparison: Radiographs 02/01/2020

CLINICAL DATA: Evaluate radial head fracture.

EXAM:
CT OF THE LOWER RIGHT EXTREMITY WITHOUT CONTRAST
TECHNIQUE: Multidetector CT imaging of the right lower extremity was performed
according to the standard protocol.

[Series 453: rotate · 0.61mm/px · 1 of 4 slices shown]
[im 3/4]
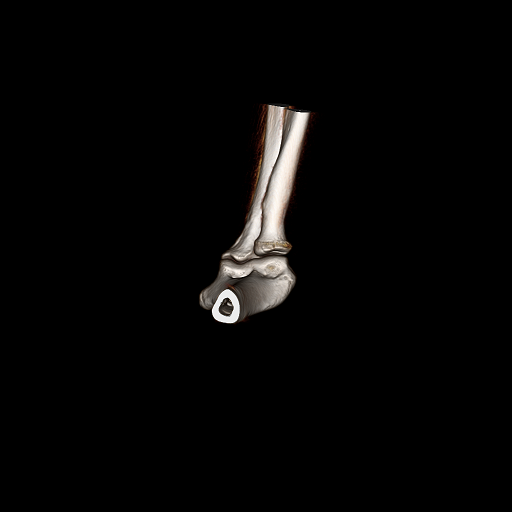

[1 of 9 positions shown; findings below may reference images not displayed]

FINDINGS: There is an intra-articular fracture of the radial head. Minimal
depression of approximately 1 mm. This involves approximately 40% of
the articular surface.

No other fractures are identified. The ulna and humerus are intact.
No fracture of the coronoid process.

Moderate-sized joint effusion.

The surrounding elbow musculature is grossly normal.
IMPRESSION: 1. Intra-articular fracture of the radial head as described above.
2. Moderate-sized joint effusion.

## 2021-07-31 DIAGNOSIS — F4310 Post-traumatic stress disorder, unspecified: Secondary | ICD-10-CM | POA: Diagnosis not present

## 2021-07-31 DIAGNOSIS — F411 Generalized anxiety disorder: Secondary | ICD-10-CM | POA: Diagnosis not present

## 2021-07-31 DIAGNOSIS — F4321 Adjustment disorder with depressed mood: Secondary | ICD-10-CM | POA: Diagnosis not present

## 2021-09-17 ENCOUNTER — Telehealth: Payer: Self-pay | Admitting: Family Medicine

## 2021-09-17 NOTE — Telephone Encounter (Signed)
Noted, will evaluate. 

## 2021-09-17 NOTE — Telephone Encounter (Signed)
[facility] Day - Client TELEPHONE ADVICE RECORD AccessNurse Patient Name: Sharon Lee Gender: Female DOB: 1995-11-17 Age: 26 Y 10 M 15 D Return Phone Number: [phone] (Primary), [phone] (Secondary) Address: [place] [address] Client [facility] Day - Client Client Site [facility] - Day Provider Milinda Antis, Idamae Schuller - MD Contact Type Call Who Is Calling Patient / Member / Family / Caregiver Call Type Triage / Clinical Relationship To Patient Self Return Phone Number [phone] (Secondary) Chief Complaint Vaginal Discharge Reason for Call Symptomatic / Request for Health Information Initial Comment Caller states she is calling because her hormones are imbalanced. Caller states she feels like she has a UTI, she is experience burning and white discharge. Translation No Nurse Assessment Nurse: Gwenyth Ober, RN, Dagoberto Date/Time (Eastern Time): 09/17/2021 4:05:09 PM Confirm and document reason for call. If symptomatic, describe symptoms. ---Caller states she feels like she has a UTI. She is experiencing burning and white discharge. No fever. Symptoms started yesterday. Also has urgency and frequency. Does the patient have any new or worsening symptoms? ---Yes Will a triage be completed? ---Yes Related visit to physician within the last 2 weeks? ---No Does the PT have any chronic conditions? (i.e. diabetes, asthma, this includes High risk factors for pregnancy, etc.) ---No Is the patient pregnant or possibly pregnant? (Ask all females between the ages of 63-55) ---No Is this a behavioral health or substance abuse call? ---No Guidelines Guideline Title Affirmed Question Affirmed Notes Nurse Date/Time (Eastern Time) Urination Pain - Female Unusual vaginal discharge (e.g., bad smelling, yellow, green, or foamywhite) Cervantes, RN, Dagoberto 09/17/2021 4:09:47 PM PLEASE  NOTE: All timestamps contained within this report are represented as [place] Standard Time. CONFIDENTIALTY NOTICE: This fax transmission is intended only for the addressee. It contains information that is legally privileged, confidential or otherwise protected from use or disclosure. If you are not the intended recipient, you are strictly prohibited from reviewing, disclosing, copying using or disseminating any of this information or taking any action in reliance on or regarding this information. If you have received this fax in error, please notify us immediately by telephone so that we can arrange for its return to [place]. Phone: [phone], Toll-Free: [phone], Fax: [phone] Page: 2 of 2 Call Id: [id number] Disp. Time Lamount Cohen Time) Disposition Final User 09/17/2021 4:06:36 PM Attempt made - no message left Gwenyth Ober, RN, Dagoberto 09/17/2021 4:12:24 PM See PCP within 24 Hours Yes Gwenyth Ober, RN, Dagoberto Final Disposition 09/17/2021 4:12:24 PM See PCP within 24 Hours Yes Cervantes, RN, Dagoberto Caller Disagree/Comply Comply Caller Understands Yes PreDisposition Did not know what to do Care Advice Given Per Guideline SEE PCP WITHIN 24 HOURS: * IF OFFICE WILL BE OPEN: You need to be examined within the next 24 hours. Call your doctor (or NP/PA) when the office opens and make an appointment. * IF OFFICE WILL BE CLOSED: You need to be seen within the next 24 hours. A clinic or an urgent care center is often a good source of care if your doctor's office is closed or you can't get an appointment. CARE ADVICE given per Urination Pain - Female (Adult) guideline. * You become worse * Fever or back pain occurs CALL BACK IF: * Avoid sexual intercourse or use a condom until the problem has been diagnosed and treated. AVOID SEXUAL INTERCOURSE: * Drink 8 to 10 cups (1,800 to 2,400 ml) of liquids a day. * Drink extra fluids. DRINK EXTRA FLUIDS: Comments  User: Kathrynn Running, RN Date/Time  Lamount Cohen Time): 09/17/2021 4:06:56 PM Unable to leave voicemail to secondary number because the mailbox is full and cannot accept any messages at this time. User: Kathrynn Running, RN Date/Time Lamount Cohen Time): 09/17/2021 4:08:00 PM Primary Number stated that patient is not at home. Stated she is at work. Will try secondary number again. Referrals REFERRED TO PCP OFFIC

## 2021-09-17 NOTE — Telephone Encounter (Signed)
Patient called and stated that she feels that her hormones id out balance. She also stated that she thinks she has a yeast infection or uti, having white discharge, a little bit of burning sensation, stomach is feeling weird, going to the bathroom more often. Patient was sent to access nurse.

## 2021-09-17 NOTE — Telephone Encounter (Signed)
Per appt notes pt already has appt to see Allayne Gitelman NP on 09/18/21 at 7:40. Sending note to Allayne Gitelman NP and [facility] CMA.

## 2021-09-18 ENCOUNTER — Encounter: Payer: Self-pay | Admitting: Primary Care

## 2021-09-18 ENCOUNTER — Ambulatory Visit: Payer: BC Managed Care – PPO | Admitting: Primary Care

## 2021-09-18 VITALS — BP 126/72 | HR 61 | Temp 98.6°F | Ht 70.0 in | Wt 281.0 lb

## 2021-09-18 DIAGNOSIS — F4323 Adjustment disorder with mixed anxiety and depressed mood: Secondary | ICD-10-CM

## 2021-09-18 DIAGNOSIS — N898 Other specified noninflammatory disorders of vagina: Secondary | ICD-10-CM | POA: Diagnosis not present

## 2021-09-18 DIAGNOSIS — R350 Frequency of micturition: Secondary | ICD-10-CM | POA: Diagnosis not present

## 2021-09-18 LAB — POC URINALSYSI DIPSTICK (AUTOMATED)
Bilirubin, UA: NEGATIVE
Blood, UA: NEGATIVE
Glucose, UA: NEGATIVE
Ketones, UA: NEGATIVE
Leukocytes, UA: NEGATIVE
Nitrite, UA: NEGATIVE
Protein, UA: NEGATIVE
Spec Grav, UA: 1.02 (ref 1.010–1.025)
Urobilinogen, UA: 0.2 E.U./dL
pH, UA: 6 (ref 5.0–8.0)

## 2021-09-18 MED ORDER — SERTRALINE HCL 25 MG PO TABS: 25.0000 mg | ORAL_TABLET | Freq: Every day | ORAL | 1 refills | Status: DC

## 2021-09-18 NOTE — Assessment & Plan Note (Signed)
Scant amount on exam noted.  Wet prep and STD testing pending. Await results.   UA negative. Encouraged to increase water intake.

## 2021-09-18 NOTE — Patient Instructions (Signed)
Start sertraline (Zoloft) 25 mg for anxiety and depression. Take 1 tablet by mouth once daily.  We will be in touch tomorrow regarding your results.  Please schedule a follow up visit for 4-6 weeks with Dr. Milinda Antis for follow up of anxiety/depression.  It was a pleasure meeting you!

## 2021-09-18 NOTE — Progress Notes (Signed)
Subjective:    Patient ID: Sharon Lee, female    DOB: January 31, 1996, 26 y.o.   MRN: [id number]  Urinary Frequency  Associated symptoms include frequency.    GRACIANA SESSA is a very pleasant 26 y.o. female patient of Dr. Milinda Antis with a history of menorrhagia, back pain, abnormal Pap smear, urinary frequency who presents today to discuss urinary frequency. She would also like to discuss depression and anxiety.   1) Urinary Frequency: She also reports vaginal discharge, dysuria, nausea, bilateral upper pelvic pain. Symptom onset two days ago. She also endorses  bilateral lower back pain (not new). She admits not drinking much water lately. She denies vaginal itching, hematuria. She is sexually active, same partner, does not use protection against STD's.   2) Anxiety and Depression: Chronic symptoms for years, worse over the last year since her father has been ill. Symptoms include tearfulness, irritability, restless, feeling down.   She is active with a therapist who can only see her once every other month. She feels that she would benefit from more frequent sessions but she cannot get in more frequently. She has been thinking about medication treatment for her symptoms, but she is reticent as she's heard back things about depression medications.      09/18/2021    8:08 AM 09/18/2020   11:22 AM 08/30/2019    2:58 PM  PHQ9 SCORE ONLY  PHQ-9 Total Score 11 11 11       09/18/2021    8:08 AM  GAD 7 : Generalized Anxiety Score  Nervous, Anxious, on Edge 3  Control/stop worrying 2  Worry too much - different things 3  Trouble relaxing 3  Restless 2  Easily annoyed or irritable 3  Afraid - awful might happen 2  Total GAD 7 Score 18  Anxiety Difficulty Somewhat difficult        Review of Systems  Genitourinary:  Positive for dysuria, frequency, pelvic pain and vaginal discharge.  Psychiatric/Behavioral:  The patient is nervous/anxious.        See HPI         Past Medical  History:  Diagnosis Date   Allergic rhinitis    Vesico-ureteral reflux     Social History   Socioeconomic History   Marital status: Single    Spouse name: Not on file   Number of children: Not on file   Years of education: Not on file   Highest education level: Not on file  Occupational History   Not on file  Tobacco Use   Smoking status: Never   Smokeless tobacco: Never  Substance and Sexual Activity   Alcohol use: No    Alcohol/week: 0.0 standard drinks of alcohol   Drug use: No   Sexual activity: Not on file  Other Topics Concern   Not on file  Social History Narrative   Not on file   Social Determinants of Health   Financial Resource Strain: Not on file  Food Insecurity: Not on file  Transportation Needs: Not on file  Physical Activity: Not on file  Stress: Not on file  Social Connections: Not on file  Intimate Partner Violence: Not on file    Past Surgical History:  Procedure Laterality Date   MYRINGOTOMY  11/2000    tubes    Family History  Problem Relation Age of Onset   Allergies Mother    Cancer Maternal Grandmother        breast   Diabetes Paternal Grandmother    Cirrhosis  Paternal Grandmother     No Known Allergies  Current Outpatient Medications on File Prior to Visit  Medication Sig Dispense Refill   cyclobenzaprine (FLEXERIL) 10 MG tablet Take 0.5-1 tablets (5-10 mg total) by mouth at bedtime as needed for muscle spasms. Caution of sedation 20 tablet 0   levonorgestrel (MIRENA, 52 MG,) 20 MCG/24HR IUD Mirena 20 mcg/24 hours (7 yrs) 52 mg intrauterine device  Take 1 device by intrauterine route.     meloxicam (MOBIC) 15 MG tablet Take 1 tablet (15 mg total) by mouth daily as needed for pain (back pain). With food 30 tablet 0   No current facility-administered medications on file prior to visit.    BP 126/72   Pulse 61   Temp 98.6 F (37 C) (Oral)   Ht 5\' 10"  (1.778 m)   Wt 281 lb (127.5 kg)   SpO2 98%   BMI 40.32 kg/m   Objective:   Physical Exam Exam conducted with a chaperone present.  Cardiovascular:     Rate and Rhythm: Normal rate and regular rhythm.  Pulmonary:     Effort: Pulmonary effort is normal.     Breath sounds: Normal breath sounds.  Genitourinary:    Labia:        Right: No tenderness or lesion.        Left: No tenderness or lesion.      Vagina: Vaginal discharge present.     Cervix: Discharge present. No cervical motion tenderness.     Comments: Did not see IUD string on exam. Scant amount of whitish discharge noted on exam.  Musculoskeletal:     Cervical back: Neck supple.  Skin:    General: Skin is warm and dry.           Assessment & Plan:   Problem List Items Addressed This Visit       Other   Adjustment reaction with anxiety and depression - Primary    Empathy provided today.  Long discussion regarding symptoms, treatment options. Referral placed for one of our therapists as she is having diffculty getting in with hers. Rx for Zoloft 25 mg sent to pharmacy.  \We discussed possible side effects of headache, GI upset, drowsiness, and SI/HI. If thoughts of SI/HI develop, we discussed to present to the emergency immediately. Patient verbalized understanding.   Follow up in 4-6 weeks with PCP for re-evaluation.        Relevant Medications   sertraline (ZOLOFT) 25 MG tablet   Other Relevant Orders   Ambulatory referral to Psychology   Vaginal discharge    Scant amount on exam noted.  Wet prep and STD testing pending. Await results.   UA negative. Encouraged to increase water intake.      Relevant Orders   WET PREP BY MOLECULAR PROBE   C. trachomatis/N. gonorrhoeae RNA   Other Visit Diagnoses     Urinary frequency       Relevant Orders   POCT Urinalysis Dipstick (Automated)          , NP

## 2021-09-18 NOTE — Assessment & Plan Note (Signed)
Empathy provided today.  Long discussion regarding symptoms, treatment options. Referral placed for one of our therapists as she is having diffculty getting in with hers. Rx for Zoloft 25 mg sent to pharmacy.  \We discussed possible side effects of headache, GI upset, drowsiness, and SI/HI. If thoughts of SI/HI develop, we discussed to present to the emergency immediately. Patient verbalized understanding.   Follow up in 4-6 weeks with PCP for re-evaluation.

## 2021-09-19 LAB — WET PREP BY MOLECULAR PROBE
Candida species: NOT DETECTED
Gardnerella vaginalis: NOT DETECTED
MICRO NUMBER:: 13697276
SPECIMEN QUALITY:: ADEQUATE
Trichomonas vaginosis: NOT DETECTED

## 2021-09-19 LAB — C. TRACHOMATIS/N. GONORRHOEAE RNA
C. trachomatis RNA, TMA: NOT DETECTED
N. gonorrhoeae RNA, TMA: NOT DETECTED

## 2021-10-10 ENCOUNTER — Other Ambulatory Visit: Payer: Self-pay | Admitting: Primary Care

## 2021-10-10 DIAGNOSIS — F4323 Adjustment disorder with mixed anxiety and depressed mood: Secondary | ICD-10-CM

## 2021-10-11 MED ORDER — SERTRALINE HCL 25 MG PO TABS: 25.0000 mg | ORAL_TABLET | Freq: Every day | ORAL | 1 refills | Status: DC

## 2021-10-14 ENCOUNTER — Telehealth: Payer: Self-pay

## 2021-10-14 ENCOUNTER — Other Ambulatory Visit: Payer: Self-pay

## 2021-10-14 ENCOUNTER — Ambulatory Visit
Admission: RE | Admit: 2021-10-14 | Discharge: 2021-10-14 | Disposition: A | Discharge status: Home / Self Care | Payer: BC Managed Care – PPO | Source: Ambulatory Visit | Attending: Emergency Medicine | Admitting: Emergency Medicine

## 2021-10-14 VITALS — BP 127/76 | HR 82 | Temp 98.3°F | Resp 18

## 2021-10-14 DIAGNOSIS — L255 Unspecified contact dermatitis due to plants, except food: Secondary | ICD-10-CM | POA: Diagnosis not present

## 2021-10-14 MED ORDER — HYDROXYZINE HCL 25 MG PO TABS: 25.0000 mg | ORAL_TABLET | Freq: Four times a day (QID) | ORAL | 0 refills | Status: AC | PRN

## 2021-10-14 MED ORDER — PREDNISONE 10 MG PO TABS: ORAL_TABLET | ORAL | 0 refills | Status: DC

## 2021-10-14 NOTE — Telephone Encounter (Signed)
Aware, will watch for correspondence  

## 2021-10-14 NOTE — Telephone Encounter (Signed)
[facility] - Client TELEPHONE ADVICE RECORD Access[facility]urse Patient Name: Sharon Lee Gender: Female DOB: [birth year] Age: 26 Y 11 M 11 D Return Phone Number: [phone] (Primary) Address: City/ State/ Zip: [place] [address] Client [facility] - Client Client Site [facility] - Day Provider Milinda Antis, Idamae Schuller - MD Contact Type Call Who Is Calling Patient / Member / Family / Caregiver Call Type Triage / Clinical Relationship To Patient Self Return Phone Number [phone] (Primary) Chief Complaint Poison [place], [place] Or [place] [facility] Reason for Call Symptomatic / Request for Health Information Initial Comment Caller states she is calling to make an appointment regarding poison ivy. Translation No Nurse Assessment Nurse: Michell Heinrich, RN, Lanora Manis Date/Time (Eastern Time): 10/14/2021 8:31:04 AM Confirm and document reason for call. If symptomatic, describe symptoms. ---Caller states that she thinks that she has poison ivy. States red and splotchy. States has moved from arm to side. States occurred after cleaning up a downed tree. Does the patient have any new or worsening symptoms? ---Yes Will a triage be completed? ---Yes Related visit to physician within the last 2 weeks? ---No Does the PT have any chronic conditions? (i.e. diabetes, asthma, this includes High risk factors for pregnancy, etc.) ---No Is the patient pregnant or possibly pregnant? (Ask all females between the ages of 35-55) ---No Is this a behavioral health or substance abuse call? ---No Guidelines Guideline Title Affirmed Question Affirmed Notes Nurse Date/Time (Eastern Time) Poison Ivy - Oak - [place] Rash involves more than 20% of the body Cantrell, RN, Lanora Manis 10/14/2021 8:32:33 AM Disp. Time Lamount Cohen Time) Disposition Final User 10/14/2021 8:29:02 AM Send To RN Personal Doug Sou 10/14/2021 8:34:33 AM See HCP within 4  Hours (or PCP triage) Yes Cantrell, RN, Army Melia NOTE: All timestamps contained within this report are represented as [place] Standard Time. CONFIDENTIALTY NOTICE: This fax transmission is intended only for the addressee. It contains information that is legally privileged, confidential or otherwise protected from use or disclosure. If you are not the intended recipient, you are strictly prohibited from reviewing, disclosing, copying using or disseminating any of this information or taking any action in reliance on or regarding this information. If you have received this fax in error, please notify us immediately by telephone so that we can arrange for its return to [place]. Phone: [phone], Toll-Free: [phone], Fax: [phone] Page: 2 of 2 Call Id: [id number] Final Disposition 10/14/2021 8:34:33 AM See HCP within 4 Hours (or PCP triage) Yes Cantrell, RN, Heloise Purpura Disagree/Comply Comply Caller Understands Yes PreDisposition Home Care Care Advice Given Per Guideline SEE HCP (OR PCP TRIAGE) WITHIN 4 HOURS: * IF OFFICE WILL BE OPEN: You need to be seen within the next 3 or 4 hours. Call your doctor (or NP/PA) now or as soon as the office opens. CALL BACK IF: * You become worse CARE ADVICE given per Poison Ivy - Oak - [place] (Adult) guideline. HYDROCORTISONE CREAM FOR ITCHING AND RASH: * Put 1% hydrocortisone cream on the itchy red area 3 times a day. Use it for one week. This will help decrease the itching and any redness. LOCAL COLD FOR ITCHING: * Put a cold wet cloth on the itchy area for 20 minutes, or massage it with an ice cube. ANTIHISTAMINE MEDICINES FOR ITCHING: * You can take DIPHENHYDRAMINE (Benadryl) for itching that KEEPS YOU AWAKE at night. It can cause sleepiness and may help with the itching. * The adult dosage of Benadryl is  25 to 50 mg by mouth and you can take it up to 4 times a day. DON'T SCRATCH: * Try not to scratch. * Scratching makes the itching worse (the  'Itch-Scratch' cycle). Comments User: Patrica Duel, RN Date/Time Lamount Cohen Time): 10/14/2021 8:40:48 AM Called backline. No appointment available in time frame. Caller advised to go to UC. Referrals GO TO FACILITY UNDECIDE

## 2021-10-14 NOTE — ED Provider Notes (Signed)
HPI  SUBJECTIVE:  Sharon Lee is a 26 y.o. female who presents with an erythematous, intensely pruritic rash starting 5 days ago after helping remove a tree limb.  The rash started the next day on the left arm, has spread to her left torso and breast.  No pain, burning sensation, new lotions, soaps, detergents, exposure to hot tub.  Not sure if she was exposed to poison ivy or poison oak.  No crusting, weeping, fevers, body aches.  She has tried cortisone cream, medicated poison ivy itch spray with mild improvement in her symptoms.  Symptoms worse with hot showers.  No history of diabetes, hypertension.  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  She denies the possibility being pregnant.  PCP: [facility].    Past Medical History:  Diagnosis Date   Allergic rhinitis    Vesico-ureteral reflux     Past Surgical History:  Procedure Laterality Date   MYRINGOTOMY  11/2000    tubes    Family History  Problem Relation Age of Onset   Allergies Mother    Cancer Maternal Grandmother        breast   Diabetes Paternal Grandmother    Cirrhosis Paternal Grandmother     Social History   Tobacco Use   Smoking status: Some Days    Types: Cigarettes   Smokeless tobacco: Never  Vaping Use   Vaping Use: Never used  Substance Use Topics   Alcohol use: No    Alcohol/week: 0.0 standard drinks of alcohol   Drug use: No    No current facility-administered medications for this encounter.  Current Outpatient Medications:    hydrOXYzine (ATARAX) 25 MG tablet, Take 1 tablet (25 mg total) by mouth every 6 (six) hours as needed for up to 10 days for itching., Disp: 40 tablet, Rfl: 0   predniSONE (DELTASONE) 10 MG tablet, 6 tabs on day 1-2, 5 tabs on day 3-4, 4 tabs on day 5-6, 3 tabs on day 7-8, 2 tabs day 9-10, 1 tab day 11-12, Disp: 42 tablet, Rfl: 0   levonorgestrel (MIRENA, 52 MG,) 20 MCG/24HR IUD, Mirena 20 mcg/24 hours (7 yrs) 52 mg intrauterine device  Take 1 device by intrauterine route., Disp:  , Rfl:    sertraline (ZOLOFT) 25 MG tablet, Take 1 tablet (25 mg total) by mouth daily. for anxiety and depression. (Patient not taking: Reported on 10/14/2021), Disp: 90 tablet, Rfl: 1  No Known Allergies   ROS  As noted in HPI.   Physical Exam  BP 127/76 (BP Location: Right Arm) Comment (BP Location): large cuff  Pulse 82   Temp 98.3 F (36.8 C) (Oral)   Resp 18   SpO2 100%   Constitutional: Well developed, well nourished, no acute distress Eyes:  EOMI, conjunctiva normal bilaterally HENT: Normocephalic, atraumatic,mucus membranes moist Respiratory: Normal inspiratory effort Cardiovascular: Normal rate GI: nondistended skin: Blanchable, nontender erythematous plaques with no vesicles, crusting over medial left arm, left breast and left torso        Musculoskeletal: no deformities Neurologic: Alert & oriented x 3, no focal neuro deficits Psychiatric: Speech and behavior appropriate   ED Course   Medications - No data to display  No orders of the defined types were placed in this encounter.   No results found for this or any previous visit (from the past 24 hour(s)). No results found.  ED Clinical Impression  1. Contact dermatitis due to plants, except food, unspecified contact dermatitis type  ED Assessment/Plan  Presentation consistent with a contact dermatitis, perhaps a poison ivy.  Will send home with a 12-day prednisone taper, advised Claritin or Zyrtec, and if that does not work, then Atarax.  Domeboro, oatmeal baths.  Return here or see PCP if not improving.  Discussed MDM, treatment plan, and plan for follow-up with patient. patient agrees with plan.   Meds ordered this encounter  Medications   predniSONE (DELTASONE) 10 MG tablet    Sig: 6 tabs on day 1-2, 5 tabs on day 3-4, 4 tabs on day 5-6, 3 tabs on day 7-8, 2 tabs day 9-10, 1 tab day 11-12    Dispense:  42 tablet    Refill:  0   hydrOXYzine (ATARAX) 25 MG tablet    Sig: Take 1 tablet  (25 mg total) by mouth every 6 (six) hours as needed for up to 10 days for itching.    Dispense:  40 tablet    Refill:  0      *This clinic note was created using Scientist, clinical (histocompatibility and immunogenetics). Therefore, there may be occasional mistakes despite careful proofreading.  ?    Domenick Gong, MD 10/15/21 [phone]

## 2021-10-14 NOTE — ED Triage Notes (Signed)
Rash to upper body, particularly left arm, left torso, neck and small area to right arm.  Has used cortisone cream.  Patient helped reve a fallen paplar tree on Tuesday.

## 2021-10-14 NOTE — Telephone Encounter (Signed)
I spoke with pt and she needs an appt this morning for rash on arm, torso and thigh; pt thinks poison ivy; no difficulty in breathing and no swelling in neck,throat,mouth or tongue; [facility] has available appt 10/14/2021 at 12 noon and pt scheduled that appt. UC & ED precautions given pt voiced understanding. Sending note to Dr Milinda Antis as Lorain Childes and [place] CMA.

## 2021-10-14 NOTE — Discharge Instructions (Addendum)
If you were given steroids, make sure you finish all of them.  You may take Claritin or Zyrtec during the day, and if that does not work, then take Atarax.  Dissolve 1 packet (or tablet) of Domeboro (aluminum acetate) in 1 pint of luke-warm water. Soak the affected areas with luke-warm Domeboro solution for 5-10 minutes twice daily. You may use apply gauze soaked in the domeboro.  Gently pat dry, You may also take oatmeal baths with Aveeno oatmeal (1 cup in half full bathtub) or cornstarch/baking soda (1 cup each in half full bathtub). To prevent the oatmeal from caking in pipes, place it in a tied sock before dropping it into the bathtub.  Go to www.goodrx.com to look up your medications. This will give you a list of where you can find your prescriptions at the most affordable prices. Or ask the pharmacist what the cash price is, or if they have any other discount programs available to help make your medication more affordable. This can be less expensive than what you would pay with insurance.

## 2021-10-23 ENCOUNTER — Ambulatory Visit: Payer: BC Managed Care – PPO | Admitting: Family Medicine

## 2021-11-14 DIAGNOSIS — Z01419 Encounter for gynecological examination (general) (routine) without abnormal findings: Secondary | ICD-10-CM | POA: Diagnosis not present

## 2021-11-14 DIAGNOSIS — Z124 Encounter for screening for malignant neoplasm of cervix: Secondary | ICD-10-CM | POA: Diagnosis not present

## 2021-11-14 DIAGNOSIS — R87612 Low grade squamous intraepithelial lesion on cytologic smear of cervix (LGSIL): Secondary | ICD-10-CM | POA: Diagnosis not present

## 2021-11-14 DIAGNOSIS — Z6841 Body Mass Index (BMI) 40.0 and over, adult: Secondary | ICD-10-CM | POA: Diagnosis not present

## 2022-05-19 ENCOUNTER — Ambulatory Visit
Admission: EM | Admit: 2022-05-19 | Discharge: 2022-05-19 | Disposition: A | Discharge status: Home / Self Care | Payer: BC Managed Care – PPO | Attending: Urgent Care | Admitting: Urgent Care

## 2022-05-19 DIAGNOSIS — M62830 Muscle spasm of back: Secondary | ICD-10-CM | POA: Diagnosis not present

## 2022-05-19 MED ORDER — CYCLOBENZAPRINE HCL 10 MG PO TABS: 10.0000 mg | ORAL_TABLET | Freq: Two times a day (BID) | ORAL | 0 refills | Status: DC | PRN
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
## 2022-05-19 NOTE — ED Triage Notes (Signed)
Provider triage  

## 2022-05-19 NOTE — Discharge Instructions (Signed)
Follow up here or with your primary care provider if your symptoms are worsening or not improving.     

## 2022-05-20 ENCOUNTER — Ambulatory Visit: Payer: BC Managed Care – PPO | Admitting: Family Medicine

## 2022-05-21 ENCOUNTER — Encounter: Payer: Self-pay | Admitting: Internal Medicine

## 2022-05-21 ENCOUNTER — Ambulatory Visit (INDEPENDENT_AMBULATORY_CARE_PROVIDER_SITE_OTHER): Payer: BC Managed Care – PPO | Admitting: Internal Medicine

## 2022-05-21 VITALS — BP 112/80 | HR 46 | Temp 98.0°F | Ht 70.0 in | Wt 293.0 lb

## 2022-05-21 DIAGNOSIS — M5440 Lumbago with sciatica, unspecified side: Secondary | ICD-10-CM | POA: Diagnosis not present

## 2022-05-21 MED ORDER — PREDNISONE 20 MG PO TABS: 40.0000 mg | ORAL_TABLET | Freq: Every day | ORAL | 0 refills | Status: DC

## 2022-05-21 NOTE — Patient Instructions (Signed)
Please try over the counter 4% lidocaine patch up and down along your back to the left of the spine. (You might want to cut the patch in half)
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
## 2022-05-21 NOTE — Assessment & Plan Note (Signed)
Seems to be muscular with spasm Nothing to suggest disc rupture No worrisome neuro finding  Will continue the ibuprofen and flexeril Try lidocaine patch Prednisone 40 x 3, 20 x 3

## 2022-06-10 DIAGNOSIS — M545 Low back pain, unspecified: Secondary | ICD-10-CM | POA: Diagnosis not present

## 2023-06-16 ENCOUNTER — Ambulatory Visit: Payer: Self-pay

## 2023-06-16 NOTE — Telephone Encounter (Signed)
 Copied from CRM [id number]. Topic: Clinical - Red Word Triage >> Jun 16, 2023 10:57 AM Sharon Lee wrote: Red Word that prompted transfer to Nurse Triage: Stomach pain, Having fever spikes, not feeling well. Sweating, body aches.    Theres anything we can do. Nurse   Chief Complaint: Vomiting  Symptoms: Vomiting, diarrhea, abdominal pain Frequency: Multiple episodes daily  Disposition: [] ED /[x] Urgent Care (no appt availability in office) / [] Appointment(In office/virtual)/ []  [facility]/ [] Home Care/ [] Refused Recommended Disposition /[] [facility]/ []  Follow-up with PCP Additional Notes: Patient states she has been experiencing vomiting, diarrhea, and abdominal pain for the last 3 days. She states that with her symptoms she has felt feverish but has not checked her temperature. She states she has not been able to keep any food down but is able to keep fluids down. Patient advised that the earliest appointment available is tomorrow. Patient states she will go to urgent care today for treatment.      Reason for Disposition  [1] MILD or MODERATE vomiting AND [2] present > 48 hours (2 days) (Exception: Mild vomiting with associated diarrhea.)  Answer Assessment - Initial Assessment Questions 1. VOMITING SEVERITY: "How many times have you vomited in the past 24 hours?"     - MILD:  1 - 2 times/day    - MODERATE: 3 - 5 times/day, decreased oral intake without significant weight loss or symptoms of dehydration    - SEVERE: 6 or more times/day, vomits everything or nearly everything, with significant weight loss, symptoms of dehydration      Moderate  2. ONSET: "When did the vomiting begin?"      3 days ago  3. FLUIDS: "What fluids or food have you vomited up today?" "Have you been able to keep any fluids down?"     Keeping fluids down  4. ABDOMEN PAIN: "Are your having any abdomen pain?" If Yes : "How bad is it and what does it feel like?" (e.g., crampy, dull, intermittent,  constant)      "It feels uncomfortable" 5. DIARRHEA: "Is there any diarrhea?" If Yes, ask: "How many times today?"      Yes, 3 times today  6. CONTACTS: "Is there anyone else in the family with the same symptoms?"      People are sick at work  7. CAUSE: "What do you think is causing your vomiting?"     Believes stomach bug is going around   8. HYDRATION STATUS: "Any signs of dehydration?" (e.g., dry mouth [not only dry lips], too weak to stand) "When did you last urinate?"     No signs of dehydration  9. OTHER SYMPTOMS: "Do you have any other symptoms?" (e.g., fever, headache, vertigo, vomiting blood or coffee grounds, recent head injury)     Fever  10. PREGNANCY: "Is there any chance you are pregnant?" "When was your last menstrual period?"       No  Protocols used: Vomiting-A-AH

## 2023-06-16 NOTE — Telephone Encounter (Signed)
 Agree with urgent care advised  Aware, will watch for correspondence

## 2023-06-17 ENCOUNTER — Ambulatory Visit
Admission: EM | Admit: 2023-06-17 | Discharge: 2023-06-17 | Disposition: A | Discharge status: Home / Self Care | Payer: Self-pay

## 2023-06-17 DIAGNOSIS — A084 Viral intestinal infection, unspecified: Secondary | ICD-10-CM

## 2023-06-17 DIAGNOSIS — B349 Viral infection, unspecified: Secondary | ICD-10-CM

## 2023-06-17 LAB — POC COVID19/FLU A&B COMBO
Covid Antigen, POC: NEGATIVE
Influenza A Antigen, POC: NEGATIVE
Influenza B Antigen, POC: NEGATIVE

## 2023-06-17 MED ORDER — ONDANSETRON 4 MG PO TBDP: 4.0000 mg | ORAL_TABLET | Freq: Three times a day (TID) | ORAL | 0 refills | Status: DC | PRN

## 2023-06-17 MED ORDER — ONDANSETRON 4 MG PO TBDP
4.0000 mg | ORAL_TABLET | Freq: Once | ORAL | Status: AC
  Administered 2023-06-17: 4 mg via ORAL

## 2023-06-17 NOTE — Discharge Instructions (Addendum)
 Take the antinausea medication as directed.    Keep yourself hydrated with clear liquids, such as water.  Follow the diarrhea diet as tolerated.   Go to the emergency department if you have worsening symptoms.    Follow up with your primary care provider.
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
## 2023-06-17 NOTE — ED Triage Notes (Signed)
 Patient to Urgent Care with complaints of emesis/ cough and post-nasal drip/ runny nose/ possible fevers/ diarrhea.   Symptoms started Sunday. Diarrhea Sunday-Tuesday.   Reports since yesterday has been unable to keep much down.

## 2023-08-08 ENCOUNTER — Telehealth: Payer: Self-pay | Admitting: Family

## 2023-08-08 DIAGNOSIS — T63481A Toxic effect of venom of other arthropod, accidental (unintentional), initial encounter: Secondary | ICD-10-CM

## 2023-08-08 MED ORDER — PREDNISONE 20 MG PO TABS: 40.0000 mg | ORAL_TABLET | Freq: Every day | ORAL | 0 refills | Status: AC

## 2023-08-08 NOTE — Progress Notes (Signed)
 E-Visit for Insect Sting  Thank you for describing the insect sting for us .  Here is how we plan to help!  Based on the information you have shared with me it looks like you have:  The 2 greatest risks from insect stings are allergic reaction, which can be fatal in some people and infection, which is more common and less serious.  Bees, wasps, yellow jackets, and hornets belong to a class of insects called Hymenoptera.  Most insect stings cause only minor discomfort.  Stings can happen anywhere on the body and can be painful.  Most stings are from honey bees or yellow jackets.  Fire ants can sting multiple times.  The sites of the stings are more likely to become infected.    Provided a home care guide for insect stings and instructions on when to call for help. and I have sent in prednisone  40 mg by mouth daily for 5 days to the pharmacy you selected.  Please make sure that you selected a pharmacy that is open now.  What can be used to prevent Insect Stings?  Insect repellant with at least 20% DEET.  Wearing long pants and shirts with socks and shoes.  Wear dark or drab-colored clothes rather than bright colors.  Avoid using perfumes and hair sprays; these attract insects.  HOME CARE ADVICE:  1. Stinger removal: The stinger looks like a tiny black dot in the sting. Use a fingernail, credit card edge, or knife-edge to scrape it off.  Don't pull it out because it squeezes out more venom. If the stinger is below the skin surface, leave it alone.  It will be shed with normal skin healing. 2. Use cold compresses to the area of the sting for 10-20 minutes.  You may repeat this as needed to relieve symptoms of pain and swelling. 3.  For pain relief, take acetominophen 650 mg 4-6 hours as needed or ibuprofen  400 mg every 6-8 hours as needed or naproxen  250-500 mg every 12 hours as needed. 4.  You can also use hydrocortisone cream 0.5% or 1% up to 4 times daily as needed for itching. 5.  If  the sting becomes very itchy, take Benadryl 25-50 mg, follow directions on box. 6.  Wash the area 2-3 times daily with antibacterial soap and warm water. 7. Call your Doctor if: Fever, a severe headache, or rash occur in the next 2 weeks. Sting area begins to look infected. Redness and swelling worsens after home treatment. Your current symptoms become worse.    MAKE SURE YOU:  Understand these instructions. Will watch your condition. Will get help right away if you are not doing well or get worse.  Thank you for choosing an e-visit.  Your e-visit answers were reviewed by a board certified advanced clinical practitioner to complete your personal care plan. Depending upon the condition, your plan could have included both over the counter or prescription medications.  Please review your pharmacy choice. Make sure the pharmacy is open so you can pick up prescription now. If there is a problem, you may contact your provider through [employer] and have the prescription routed to another pharmacy.  Your safety is important to us . If you have drug allergies check your prescription carefully.   For the next 24 hours you can use MyChart to ask questions about today's visit, request a non-urgent call back, or ask for a work or school excuse. You will get an email in the next two days asking about  your experience. I hope that your e-visit has been valuable and will speed your recovery.   Approximately 5 minutes was spent documenting and reviewing patient's chart.

## 2023-08-18 ENCOUNTER — Ambulatory Visit: Payer: Self-pay | Admitting: Family Medicine

## 2023-08-18 ENCOUNTER — Ambulatory Visit (INDEPENDENT_AMBULATORY_CARE_PROVIDER_SITE_OTHER): Payer: Self-pay | Admitting: Family Medicine

## 2023-08-18 ENCOUNTER — Encounter: Payer: Self-pay | Admitting: Family Medicine

## 2023-08-18 VITALS — BP 123/81 | HR 77 | Temp 98.4°F | Ht 70.0 in | Wt 299.4 lb

## 2023-08-18 DIAGNOSIS — F4323 Adjustment disorder with mixed anxiety and depressed mood: Secondary | ICD-10-CM

## 2023-08-18 MED ORDER — SERTRALINE HCL 50 MG PO TABS: 50.0000 mg | ORAL_TABLET | Freq: Every day | ORAL | 2 refills | Status: AC
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
## 2023-08-18 NOTE — Patient Instructions (Signed)
 Take care of yourself the best you can    Take the sertraline  50 mg daily in the evening  If any intolerable side effects or if you feel worse please hold it and let us  know   I put the referral in for mental health counseling  Please let us  know if you don't hear in 1-2 weeks to set that up  Follow up in 4-6 weeks

## 2023-08-18 NOTE — Assessment & Plan Note (Addendum)
 This has significantly worsened with increase in stress and loss of insurance (being off med for over 3 months)  Heavy stressors- lost father, mother with memory problems and also recently broken engagement   Tearful / anxious and down No SI   Reviewed stressors/ coping techniques/symptoms/ support sources/ tx options and side effects in detail today   Will start back on sertraline  50 mg daily (tolerated well in past) Discussed expectations of SSRI medication including time to effectiveness and mechanism of action, also poss of side effects (early and late)- including mental fuzziness, weight or appetite change, nausea and poss of worse dep or anxiety (even suicidal thoughts)  Pt voiced understanding and will stop med and update if this occurs    Referral to counseling as well  Follow up in 4-6 wk  Call back and Er precautions noted in detail today

## 2023-09-10 ENCOUNTER — Ambulatory Visit (INDEPENDENT_AMBULATORY_CARE_PROVIDER_SITE_OTHER): Admitting: Licensed Clinical Social Worker

## 2023-09-10 DIAGNOSIS — F331 Major depressive disorder, recurrent, moderate: Secondary | ICD-10-CM | POA: Insufficient documentation

## 2023-09-10 DIAGNOSIS — F411 Generalized anxiety disorder: Secondary | ICD-10-CM
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
## 2023-09-18 ENCOUNTER — Ambulatory Visit: Admitting: Licensed Clinical Social Worker

## 2023-09-18 DIAGNOSIS — F411 Generalized anxiety disorder: Secondary | ICD-10-CM

## 2023-09-18 DIAGNOSIS — F331 Major depressive disorder, recurrent, moderate: Secondary | ICD-10-CM | POA: Diagnosis not present

## 2023-09-18 NOTE — Progress Notes (Signed)
 [facility] Counselor/Therapist Progress Note  Patient ID: Sharon Lee, MRN: [id number]    Date: 09/18/23  Time Spent: 1000  am - 1054 am : 54 Minutes  Treatment Type: Individual Therapy.  Reported Symptoms: Patient reports that her father passed in fall of 2023. Patient states that she is having issues in her relationship and her mother is in poor health. Patient reports symptoms of depression and anxiety.    Mental Status Exam: Appearance:  Casual     Behavior: Appropriate  Motor: Normal  Speech/Language:  Clear and Coherent  Affect: Appropriate  Mood: normal  Thought process: normal  Thought content:   WNL  Sensory/Perceptual disturbances:   WNL  Orientation: oriented to person, place, time/date, situation, day of week, month of year, and year  Attention: Good  Concentration: Good  Memory: WNL  Fund of knowledge:  Good  Insight:   Good  Judgment:  Good  Impulse Control: Good   Risk Assessment: Danger to Self:  No Self-injurious Behavior: No Danger to Others: No Duty to Warn:no Physical Aggression / Violence:No  Access to Firearms a concern: No  Gang Involvement:No   Subjective:   Sharon Lee participated from home, via phone due to connectivity issues. Sharon Lee consented to treatment. Therapist participated from home office. We met online due to patient request.   Sharon Lee presented for her session in a more positive mood than her previous session. Patient reports that the past few weeks have been better and she feels that things have improved a bit. Patient reports that she loves her boyfriend and believes in him. Sharon Lee states that's she feels that he wants to do better and she wants to encourage him to do better. Sharon Lee discussed the work they are doing on the house and their goals for getting their home redone. Patient states that she is working on positivity and focusing on improving her thoughts and emotions.  Clinician actively  listened and processed with patient her thoughts. Clinician encouraged patient to do what she feels is best and to realize that no one can tell her what is best for her. Although others may have her best interest in mind only she can make the choice as to what she plans to do. Clinician encouraged patient to continue to move forward and set goals and focus on the good in her life, while still setting boundaries to protect herself.  Sharon Lee was pleasant and cooperative and motivated for treatment. Patient will utilize coping skills to cope with her symptoms of anxiety and depression.She will continue to engage in bi weekly therapy sessions. Treatment planning to be reviewed by 09/09/2024.  Interventions: Cognitive Behavioral Therapy, Dialectical Behavioral Therapy, Assertiveness/Communication, Motivational Interviewing, and Solution-Oriented/Positive Psychology  Diagnosis: Generalized Anxiety Disorder, Major Depressive Disorder, recurrent moderate    Damien Junk MSW, LCSW/DATE 09/18/2023

## 2023-09-22 ENCOUNTER — Ambulatory Visit (INDEPENDENT_AMBULATORY_CARE_PROVIDER_SITE_OTHER): Admitting: Family Medicine

## 2023-09-22 ENCOUNTER — Encounter: Payer: Self-pay | Admitting: Family Medicine

## 2023-09-22 VITALS — BP 122/82 | HR 85 | Temp 98.2°F | Ht 70.0 in | Wt 308.0 lb

## 2023-09-22 DIAGNOSIS — F4323 Adjustment disorder with mixed anxiety and depressed mood: Secondary | ICD-10-CM

## 2023-09-22 NOTE — Assessment & Plan Note (Signed)
 Doing better with mental health therapy and also sertraline  50 mg daily  Tolerating it well  More motivated, sleeping is more normal  Some compulsive eating- plans to d/w her therapist  Encouraged her to move more when able  Going back to work should help also  We will continue this dose  Instructed to call if pt wants to go up in the future   Call back and Er precautions noted in detail today
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
## 2023-09-22 NOTE — Patient Instructions (Signed)
 Continue therapy  Talk to your counselor about emotional eating when you can   Continue the zoloft  at this dose   Try to do things for yourself  Move more Give yourself permission to rest also    Let us  know if you need anything

## 2023-10-02 ENCOUNTER — Ambulatory Visit (INDEPENDENT_AMBULATORY_CARE_PROVIDER_SITE_OTHER): Admitting: Licensed Clinical Social Worker

## 2023-10-02 DIAGNOSIS — F331 Major depressive disorder, recurrent, moderate: Secondary | ICD-10-CM

## 2023-10-02 DIAGNOSIS — F411 Generalized anxiety disorder: Secondary | ICD-10-CM
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
## 2023-10-21 ENCOUNTER — Ambulatory Visit: Admitting: Licensed Clinical Social Worker

## 2024-01-16 ENCOUNTER — Ambulatory Visit
Admission: RE | Admit: 2024-01-16 | Discharge: 2024-01-16 | Disposition: A | Discharge status: Home / Self Care | Attending: Emergency Medicine | Admitting: Emergency Medicine

## 2024-01-16 VITALS — BP 126/82 | HR 95 | Temp 98.7°F | Resp 14 | Ht 70.0 in | Wt 308.0 lb

## 2024-01-16 DIAGNOSIS — M5442 Lumbago with sciatica, left side: Secondary | ICD-10-CM | POA: Diagnosis not present

## 2024-01-16 MED ORDER — PREDNISONE 10 MG (21) PO TBPK: ORAL_TABLET | ORAL | 0 refills | Status: AC

## 2024-01-16 MED ORDER — BACLOFEN 10 MG PO TABS: 10.0000 mg | ORAL_TABLET | Freq: Three times a day (TID) | ORAL | 0 refills | Status: AC

## 2024-01-16 MED ORDER — DEXAMETHASONE SOD PHOSPHATE PF 10 MG/ML IJ SOLN
10.0000 mg | Freq: Once | INTRAMUSCULAR | Status: AC
  Administered 2024-01-16: 10 mg via INTRAMUSCULAR
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
## 2024-01-16 NOTE — ED Triage Notes (Signed)
 Patient reports muscle spasms in her lower back for the past 2 weeks.  Patient states that it all started after lifting a potted tree.  Patient states that the pain radiates down her left leg.

## 2024-01-16 NOTE — Discharge Instructions (Addendum)
 Take the prednisone  according to the package instructions.  You will take it each morning at breakfast time starting tomorrow morning.  Take the baclofen , 10 mg every 8 hours, on a schedule for the next 48 hours and then as needed.  Apply moist heat to your back for 30 minutes at a time 2-3 times a day to improve blood flow to the area and help remove the lactic acid causing the spasm.  Follow the back exercises given at discharge.  Return for reevaluation for any new or worsening symptoms.
